# Patient Record
Sex: Female | Born: 2000 | Race: Black or African American | Hispanic: No | Marital: Single
Health system: Southern US, Community
[De-identification: ages and names within clinical notes are randomized; demographics above are authoritative.]

---
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
## 2022-09-28 IMAGING — US US OB < 14 WEEKS - US OB TV
1 series · 15 of 28 positions shown · non-contrast
Comparison: None.

CLINICAL DATA: Pelvic pain and cramping. First trimester pregnancy.

EXAM:
OBSTETRIC <14 WK US AND TRANSVAGINAL OB US
TECHNIQUE: Both transabdominal and transvaginal ultrasound examinations were
performed for complete evaluation of the gestation as well as the
maternal uterus, adnexal regions, and pelvic cul-de-sac.
Transvaginal technique was performed to assess early pregnancy.

[Series 1: us ob < 14 weeks - us ob tv · 15 of 45 slices shown]
[im 1/45]
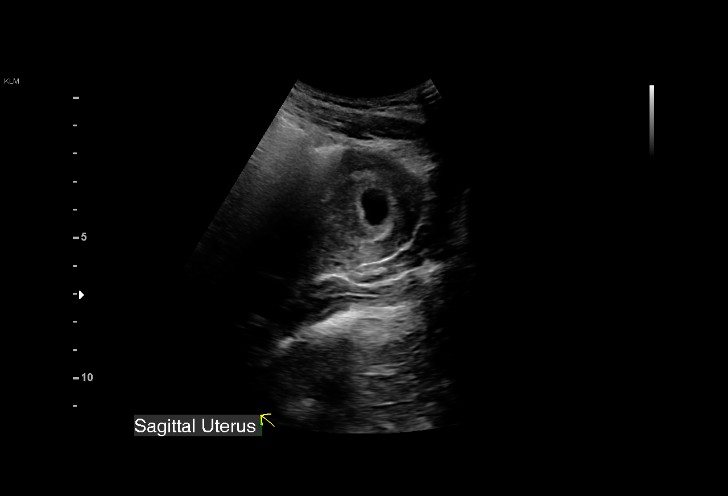
[im 4/45]
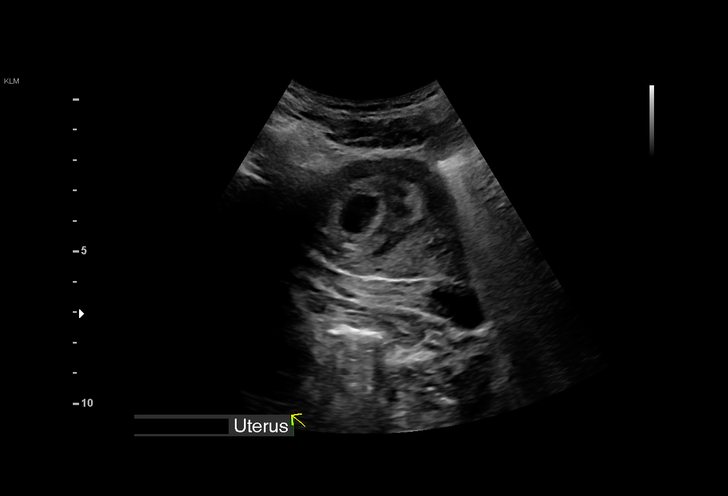
[im 7/45]
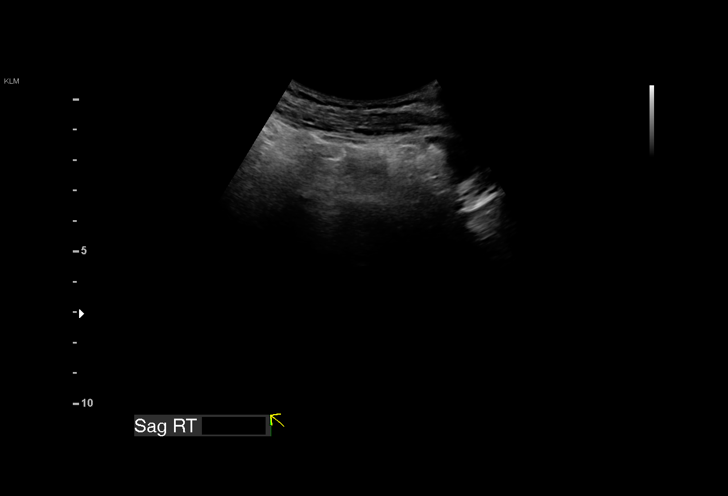
[im 10/45]
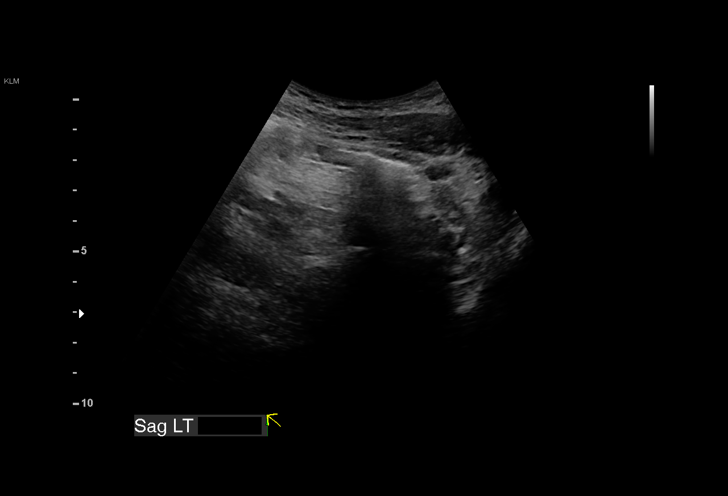
[im 14/45]
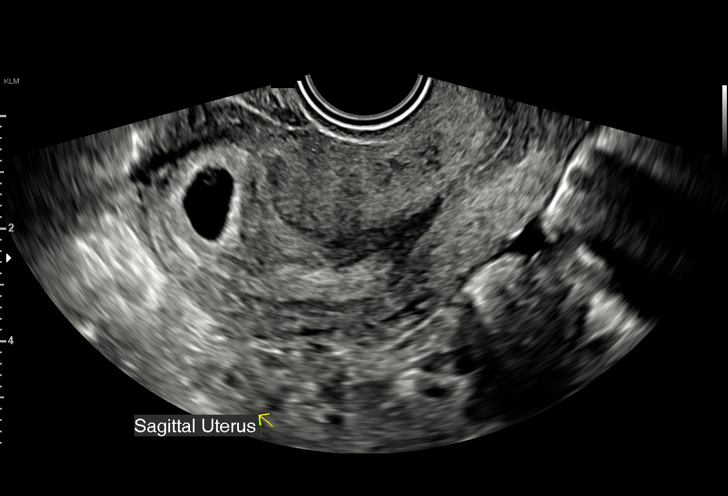
[im 17/45]
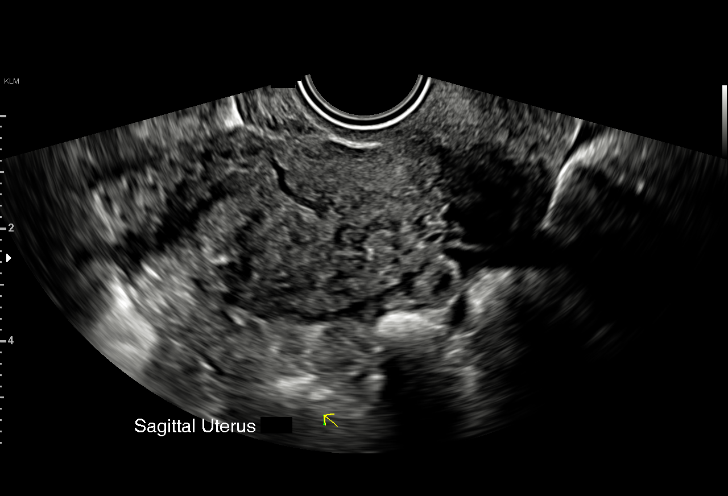
[im 20/45]
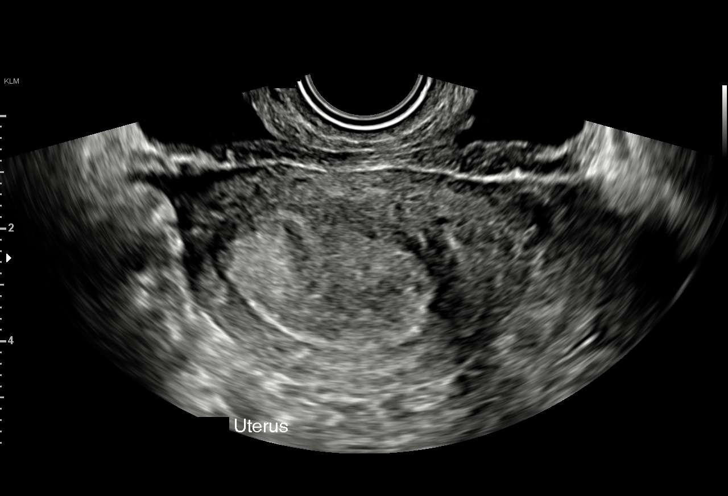
[im 23/45]
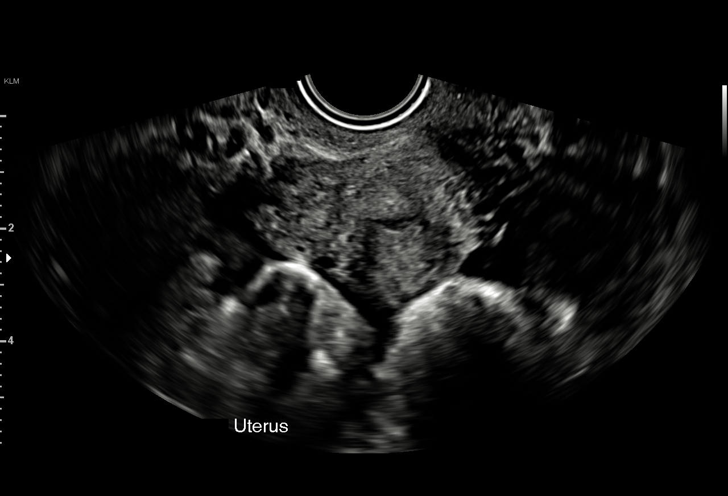
[im 25/45]
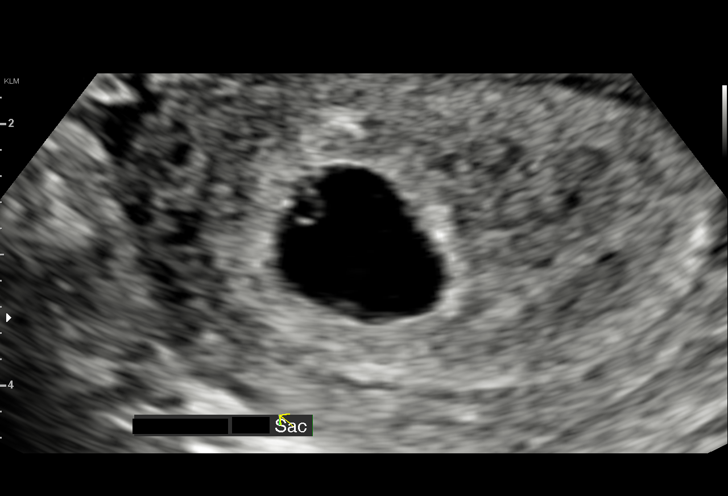
[im 28/45]
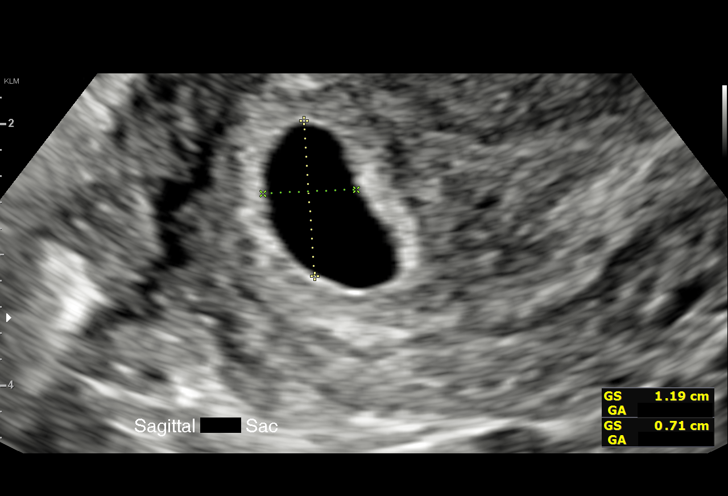
[im 31/45]
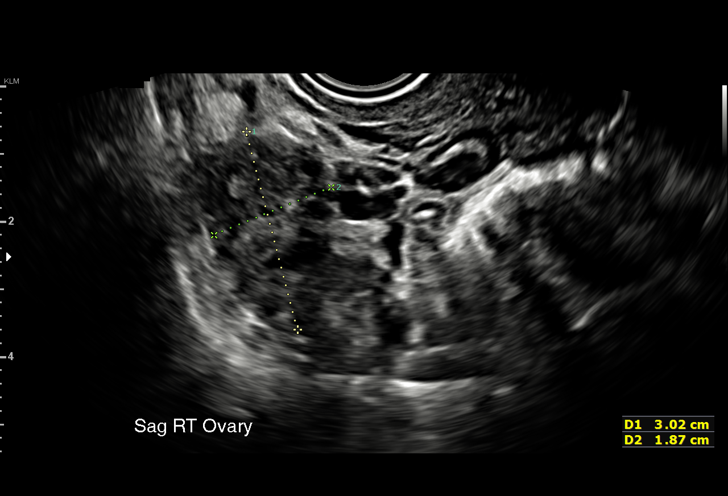
[im 35/45]
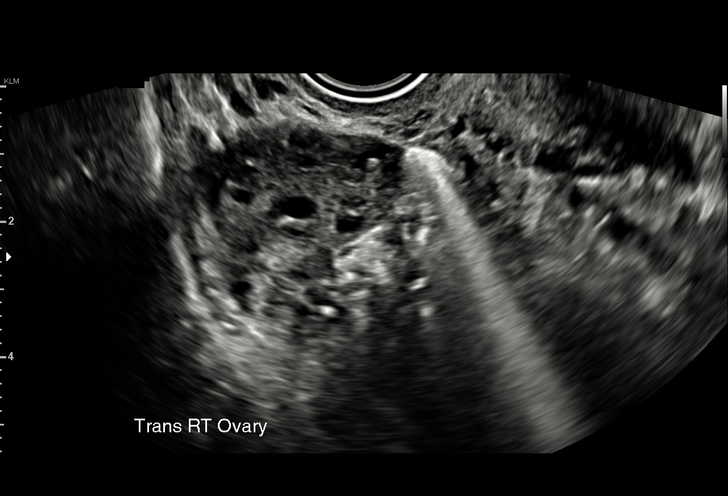
[im 38/45]
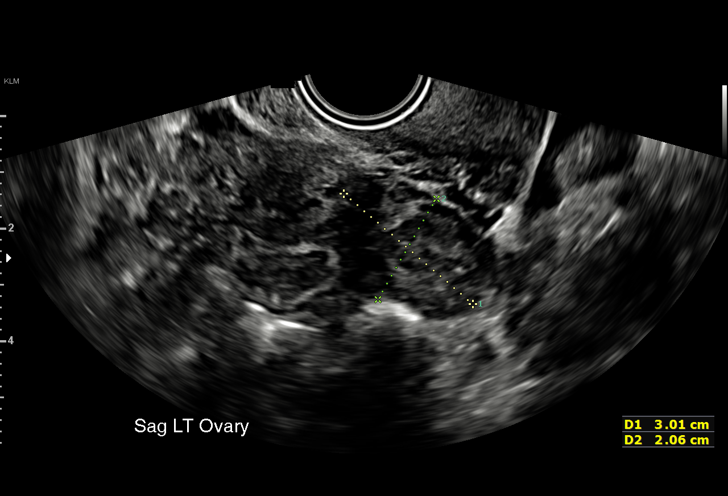
[im 41/45]
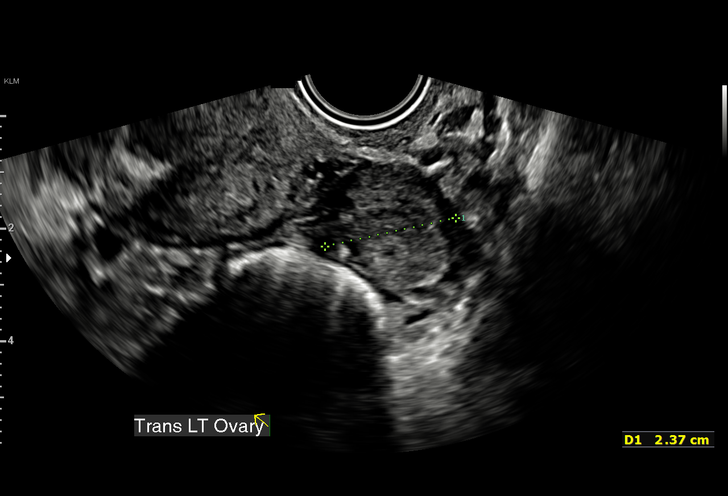
[im 45/45]
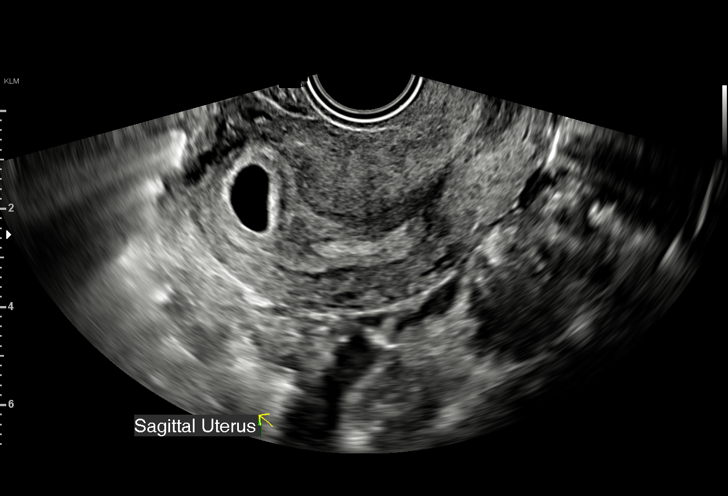

[15 of 28 positions shown; findings below may reference images not displayed]

FINDINGS: Intrauterine gestational sac: Single

Yolk sac:  Visualized.

Embryo:  Not Visualized.

MSD: 10 mm   5 w   5 d

Subchorionic hemorrhage:  None visualized.

Maternal uterus/adnexae: Both ovaries are normal in appearance. No
mass or abnormal free fluid identified.
IMPRESSION: Single intrauterine gestational sac measuring 5 weeks 5 days by mean
sac diameter. Suggest correlation with serial b-hCG levels, and
consider followup ultrasound to assess viability in 10 days.
